# Patient Record
Sex: Female | Born: 1965 | Race: Black or African American | Hispanic: No | Marital: Married | Smoking: Never smoker
Health system: Southern US, Community
[De-identification: ages and names within clinical notes are randomized; demographics above are authoritative.]

## PROBLEM LIST (undated history)

## (undated) DIAGNOSIS — E785 Hyperlipidemia, unspecified: Secondary | ICD-10-CM

## (undated) DIAGNOSIS — I10 Essential (primary) hypertension: Secondary | ICD-10-CM

## (undated) HISTORY — PX: LEG SURGERY: SHX1003

---

## 2014-06-23 DIAGNOSIS — R569 Unspecified convulsions: Secondary | ICD-10-CM

## 2014-06-23 HISTORY — PX: CARDIAC DEFIBRILLATOR PLACEMENT: SHX171

## 2014-06-23 HISTORY — DX: Unspecified convulsions: R56.9

## 2019-12-01 ENCOUNTER — Other Ambulatory Visit: Payer: Self-pay

## 2019-12-01 ENCOUNTER — Emergency Department: Payer: Medicare Other

## 2019-12-01 ENCOUNTER — Emergency Department
Admission: EM | Admit: 2019-12-01 | Discharge: 2019-12-01 | Disposition: A | Discharge status: Home / Self Care | Payer: Medicare Other | Attending: Emergency Medicine | Admitting: Emergency Medicine

## 2019-12-01 DIAGNOSIS — M79605 Pain in left leg: Secondary | ICD-10-CM | POA: Diagnosis not present

## 2019-12-01 DIAGNOSIS — I10 Essential (primary) hypertension: Secondary | ICD-10-CM | POA: Insufficient documentation

## 2019-12-01 DIAGNOSIS — R2243 Localized swelling, mass and lump, lower limb, bilateral: Secondary | ICD-10-CM | POA: Diagnosis not present

## 2019-12-01 DIAGNOSIS — M79604 Pain in right leg: Secondary | ICD-10-CM | POA: Diagnosis present

## 2019-12-01 DIAGNOSIS — Z79899 Other long term (current) drug therapy: Secondary | ICD-10-CM | POA: Diagnosis not present

## 2019-12-01 HISTORY — DX: Essential (primary) hypertension: I10

## 2019-12-01 HISTORY — DX: Hyperlipidemia, unspecified: E78.5

## 2019-12-01 LAB — FIBRIN DERIVATIVES D-DIMER (ARMC ONLY): Fibrin derivatives D-dimer (ARMC): 1297.46 ng/mL (FEU) — ABNORMAL HIGH (ref 0.00–499.00)

## 2019-12-01 LAB — CBC WITH DIFFERENTIAL/PLATELET
Abs Immature Granulocytes: 0 10*3/uL (ref 0.00–0.07)
Basophils Absolute: 0 10*3/uL (ref 0.0–0.1)
Basophils Relative: 1 %
Eosinophils Absolute: 0.2 10*3/uL (ref 0.0–0.5)
Eosinophils Relative: 3 %
HCT: 34.5 % — ABNORMAL LOW (ref 36.0–46.0)
Hemoglobin: 10.6 g/dL — ABNORMAL LOW (ref 12.0–15.0)
Immature Granulocytes: 0 %
Lymphocytes Relative: 32 %
Lymphs Abs: 1.4 10*3/uL (ref 0.7–4.0)
MCH: 28.8 pg (ref 26.0–34.0)
MCHC: 30.7 g/dL (ref 30.0–36.0)
MCV: 93.8 fL (ref 80.0–100.0)
Monocytes Absolute: 0.3 10*3/uL (ref 0.1–1.0)
Monocytes Relative: 8 %
Neutro Abs: 2.5 10*3/uL (ref 1.7–7.7)
Neutrophils Relative %: 56 %
Platelets: 242 10*3/uL (ref 150–400)
RBC: 3.68 MIL/uL — ABNORMAL LOW (ref 3.87–5.11)
RDW: 13.7 % (ref 11.5–15.5)
WBC: 4.4 10*3/uL (ref 4.0–10.5)
nRBC: 0 % (ref 0.0–0.2)

## 2019-12-01 LAB — COMPREHENSIVE METABOLIC PANEL
ALT: 17 U/L (ref 0–44)
AST: 23 U/L (ref 15–41)
Albumin: 4.1 g/dL (ref 3.5–5.0)
Alkaline Phosphatase: 78 U/L (ref 38–126)
Anion gap: 7 (ref 5–15)
BUN: 21 mg/dL — ABNORMAL HIGH (ref 6–20)
CO2: 30 mmol/L (ref 22–32)
Calcium: 9.5 mg/dL (ref 8.9–10.3)
Chloride: 101 mmol/L (ref 98–111)
Creatinine, Ser: 0.95 mg/dL (ref 0.44–1.00)
GFR calc Af Amer: >60 mL/min (ref >60)
GFR calc non Af Amer: >60 mL/min (ref >60)
Glucose, Bld: 111 mg/dL — ABNORMAL HIGH (ref 70–99)
Potassium: 4.3 mmol/L (ref 3.5–5.1)
Sodium: 138 mmol/L (ref 135–145)
Total Bilirubin: 0.6 mg/dL (ref 0.3–1.2)
Total Protein: 7.9 g/dL (ref 6.5–8.1)

## 2019-12-01 LAB — LACTIC ACID, PLASMA
Lactic Acid, Venous: 0.8 mmol/L (ref 0.5–1.9)
Lactic Acid, Venous: 0.7 mmol/L (ref 0.5–1.9)

## 2019-12-01 LAB — PROTIME-INR
INR: 1 (ref 0.8–1.2)
Prothrombin Time: 13.1 seconds (ref 11.4–15.2)

## 2019-12-01 MED ORDER — IOHEXOL 350 MG/ML SOLN
75.0000 mL | Freq: Once | INTRAVENOUS | Status: AC | PRN
  Administered 2019-12-01: 75 mL via INTRAVENOUS
  Filled 2019-12-01: qty 75

## 2019-12-01 MED ORDER — SODIUM CHLORIDE 0.9% FLUSH: 3.0000 mL | Freq: Once | INTRAVENOUS | Status: DC

## 2019-12-01 NOTE — ED Notes (Signed)
See triage note  Presents with swelling to both legs   States she also has had some redness and tenderness   Also has a knot around ankle area  Ambulates well

## 2019-12-01 NOTE — ED Triage Notes (Signed)
PT to ED from home c/o redness, swelling and tenderness to bilateral legs. States right leg has a lump with redness around it. PT denies fevers or SHOB. No known Hx of CHF. Edema is non pitting bilaterally.

## 2019-12-01 NOTE — ED Provider Notes (Signed)
[facility] Emergency Department Provider Note   ____________________________________________   First MD Initiated Contact with Patient 12/01/19 [phone]     (approximate)  I have reviewed the triage vital signs and the nursing notes.   HISTORY  Chief Complaint Leg Pain    HPI Megan David is a 54 y.o. female patient presents with bilateral leg pain and edema.  Patient states he was concerned about a lump redness in the right leg.  Patient denies fever dyspnea or chest pain.  Patient had ultrasound performed of bilateral extremities last month with no acute findings secondary to elevated D-dimer.  Patient is scheduled to see vascular clinic secondary to peripheral edema and varicose veins.  Patient is currently taking Lasix 40 mg for peripheral edema.  Patient rates her pain as a 5/10.  Patient described pain as "achy".  No palliative measure for complaint.         Past Medical History:  Diagnosis Date  . Hyperlipemia   . Hypertension   . Seizure (HCC) 2016    There are no problems to display for this patient.     Prior to Admission medications   Medication Sig Start Date End Date Taking? Authorizing Provider  albuterol (VENTOLIN HFA) 108 (90 Base) MCG/ACT inhaler Inhale into the lungs every 6 (six) hours as needed for wheezing or shortness of breath.   Yes [provider]  atorvastatin (LIPITOR) 40 MG tablet Take 40 mg by mouth daily.   Yes [provider]  carvedilol (COREG) 12.5 MG tablet Take 12.5 mg by mouth 2 (two) times daily with a meal.   Yes [provider]  fluticasone (FLONASE) 50 MCG/ACT nasal spray Place 1 spray into both nostrils daily.   Yes [provider]  furosemide (LASIX) 40 MG tablet Take 40 mg by mouth.   Yes [provider]  lamoTRIgine (LAMICTAL) 150 MG tablet Take 150 mg by mouth daily.   Yes [provider]  losartan (COZAAR) 50 MG tablet Take 50 mg by mouth daily.    Yes [provider]    Allergies Lisinopril  No family history on file.  Social History Social History   Tobacco Use  . Smoking status: Never Smoker  . Smokeless tobacco: Never Used  Substance Use Topics  . Alcohol use: Yes    Comment: occaisonal   . Drug use: Never    Review of Systems Constitutional: No fever/chills.  Eyes: No visual changes. ENT: No sore throat. Cardiovascular: Denies chest pain. Respiratory: Denies shortness of breath. Gastrointestinal: No abdominal pain.  No nausea, no vomiting.  No diarrhea.  No constipation. Genitourinary: Negative for dysuria. Musculoskeletal: Bilateral leg pain.   Skin: Negative for rash.  Bilateral leg edema. Neurological: Negative for headaches, focal weakness or numbness. Endocrine:  Hyperlipidemia and hypertension. Allergic/Immunilogical: Lisinopril. ____________________________________________   PHYSICAL EXAM:  VITAL SIGNS: ED Triage Vitals  Enc Vitals Group     BP 12/01/19 0642 129/69     Pulse Rate 12/01/19 0642 69     Resp 12/01/19 0642 16     Temp 12/01/19 0642 97.9 F (36.6 C)     Temp src --      SpO2 12/01/19 0642 100 %     Weight 12/01/19 0643 (!) 315 lb (142.9 kg)     Height 12/01/19 0643 5\' 3"  (1.6 m)     Head Circumference --      Peak Flow --      Pain Score 12/01/19 0643 5  Pain Loc --      Pain Edu? --      Excl. in [place]? --    Constitutional: Alert and oriented. Well appearing and in no acute distress. Cardiovascular: Normal rate, regular rhythm. Grossly normal heart sounds.  Good peripheral circulation. Respiratory: Normal respiratory effort.  No retractions. Lungs CTAB. Gastrointestinal: Soft and nontender. No distention. No abdominal bruits. No CVA tenderness. Musculoskeletal: Bilateral leg edema. neurologic:  Normal speech and language. No gross focal neurologic deficits are appreciated. No gait instability. Skin:  Skin is warm, dry and intact. No rash noted. Psychiatric: Mood  and affect are normal. Speech and behavior are normal.  ____________________________________________   LABS (all labs ordered are listed, but only abnormal results are displayed)  Labs Reviewed  COMPREHENSIVE METABOLIC PANEL - Abnormal; Notable for the following components:      Result Value   Glucose, Bld 111 (*)    BUN 21 (*)    All other components within normal limits  CBC WITH DIFFERENTIAL/PLATELET - Abnormal; Notable for the following components:   RBC 3.68 (*)    Hemoglobin 10.6 (*)    HCT 34.5 (*)    All other components within normal limits  FIBRIN DERIVATIVES D-DIMER ([facility] ONLY) - Abnormal; Notable for the following components:   Fibrin derivatives D-dimer ([facility]) 1,297.46 (*)    All other components within normal limits  LACTIC ACID, PLASMA  LACTIC ACID, PLASMA  PROTIME-INR  URINALYSIS, COMPLETE (UACMP) WITH MICROSCOPIC   ____________________________________________  EKG   ____________________________________________  RADIOLOGY  ED MD interpretation:    Official radiology report(s): CT ANGIO CHEST PE W OR WO CONTRAST  Result Date: 12/01/2019 CLINICAL DATA:  Left leg DVT. EXAM: CT ANGIOGRAPHY CHEST WITH CONTRAST TECHNIQUE: Multidetector CT imaging of the chest was performed using the standard protocol during bolus administration of intravenous contrast. Multiplanar CT image reconstructions and MIPs were obtained to evaluate the vascular anatomy. CONTRAST:  19mL OMNIPAQUE IOHEXOL 350 MG/ML SOLN COMPARISON:  None. FINDINGS: Cardiovascular: No filling defects in the pulmonary arteries to suggest pulmonary emboli. Cardiomegaly. AICD in place. Aorta normal caliber. No aneurysm or dissection. Mediastinum/Nodes: No mediastinal, hilar, or axillary adenopathy. Trachea and esophagus are unremarkable. Thyroid unremarkable. Lungs/Pleura: No confluent opacities or effusions. Left base atelectasis. Upper Abdomen: Imaging into the upper abdomen shows no acute findings.  Musculoskeletal: Left chest wall pacer battery pack. Chest wall soft tissues unremarkable. No acute bony abnormality. Review of the MIP images confirms the above findings. IMPRESSION: No evidence of pulmonary embolus. Cardiomegaly. Left base atelectasis. No acute cardiopulmonary disease. Electronically Signed   By: Rolm Baptise M.D.   On: 12/01/2019 10:26    ____________________________________________   PROCEDURES  Procedure(s) performed (including Critical Care):  Procedures   ____________________________________________   INITIAL IMPRESSION / ASSESSMENT AND PLAN / ED COURSE  As part of my medical decision making, I reviewed the following data within the [facility]     Patient presents with bilateral lower peripheral edema concerning for DVT.  Patient state i intermitting mild dyspnea but denies chest pain.  Discussed lab results with patient continue to show elevated D-dimer.  Discussed no acute findings on CT angiogram of the chest.  Patient is reassured no acute process at this time will follow-up with schedule vascular clinic.  Continue previous medications.  Return to ED if condition worsens.    Megan David was evaluated in Emergency Department on 12/01/2019 for the symptoms described in the history of present illness. She was evaluated in the  context of the global COVID-19 pandemic, which necessitated consideration that the patient might be at risk for infection with the SARS-CoV-2 virus that causes COVID-19. Institutional protocols and algorithms that pertain to the evaluation of patients at risk for COVID-19 are in a state of rapid change based on information released by regulatory bodies including the CDC and federal and state organizations. These policies and algorithms were followed during the patient's care in the ED.       ____________________________________________   FINAL CLINICAL IMPRESSION(S) / ED DIAGNOSES  Final diagnoses:  Bilateral leg pain      ED Discharge Orders    None       Note:  This document was prepared using Dragon voice recognition software and may include unintentional dictation errors.    Joni Reining, PA-C 12/01/19 1058    Arnaldo Natal, MD 12/01/19 [phone]

## 2019-12-01 NOTE — Discharge Instructions (Addendum)
No acute findings except elevated D-dimer on your labs today.  Your CT angiogram was negative for any acute findings to include DVT.  Follow-up with scheduled vascular clinic for definitive evaluation and treatment.

## 2021-06-25 IMAGING — CT CT ANGIO CHEST
2 of 6 series · 19 of 46 positions shown · IV contrast (APPLIED)
Comparison: None.

CLINICAL DATA: Left leg DVT.

EXAM:
CT ANGIOGRAPHY CHEST WITH CONTRAST
TECHNIQUE: Multidetector CT imaging of the chest was performed using the
standard protocol during bolus administration of intravenous
contrast. Multiplanar CT image reconstructions and MIPs were
obtained to evaluate the vascular anatomy.
CONTRAST:  75mL OMNIPAQUE IOHEXOL 350 MG/ML SOLN

[Series 5: thins · axial · 0.63mm/px · z∈[-655,-430]mm · 16 of 247 slices shown]
[im 11/247  lung]
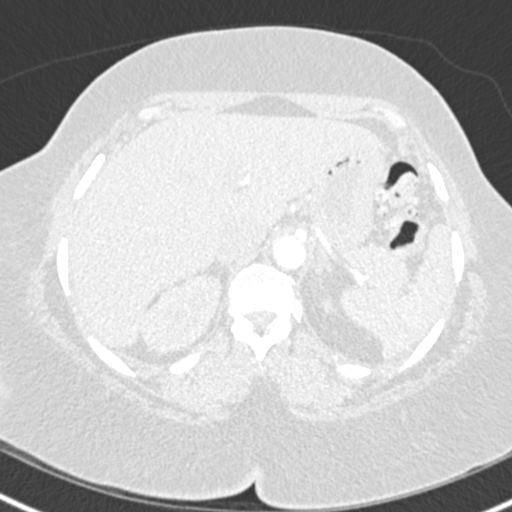
[im 33/247  soft-tissue]
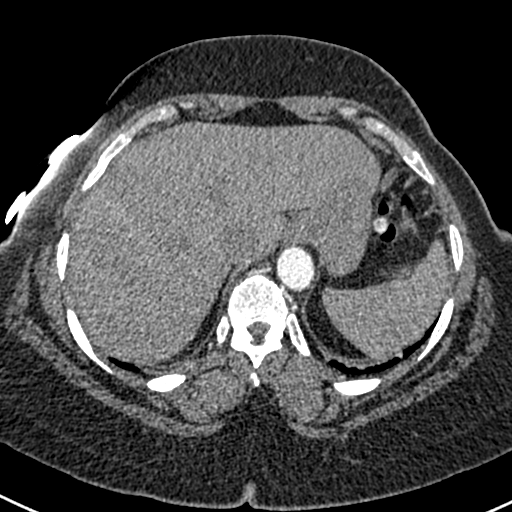
[im 43/247  lung]
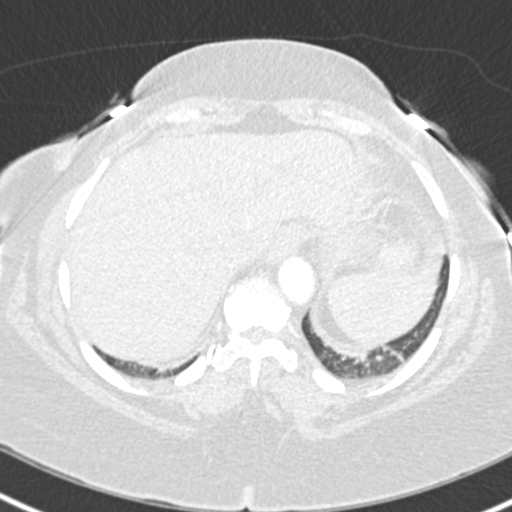
[im 54/247  soft-tissue]
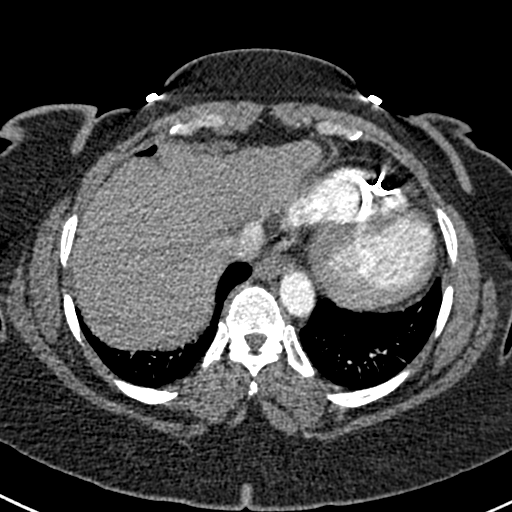
[im 75/247  lung]
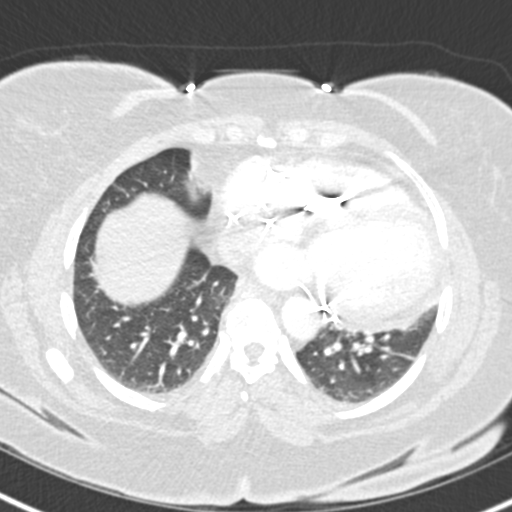
[im 86/247  soft-tissue]
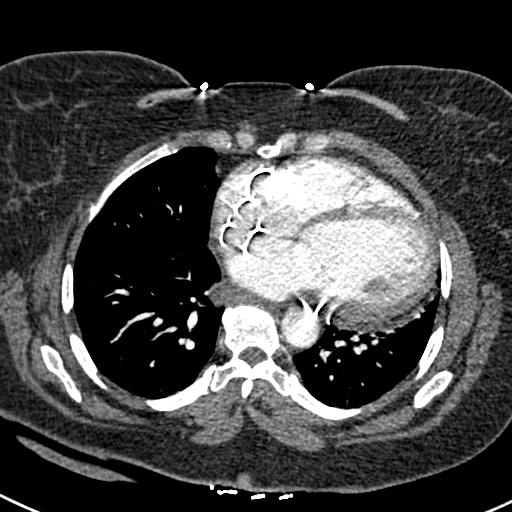
[im 97/247  lung]
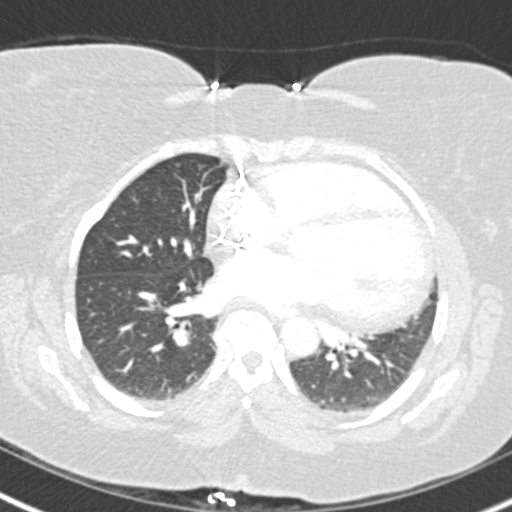
[im 118/247  soft-tissue]
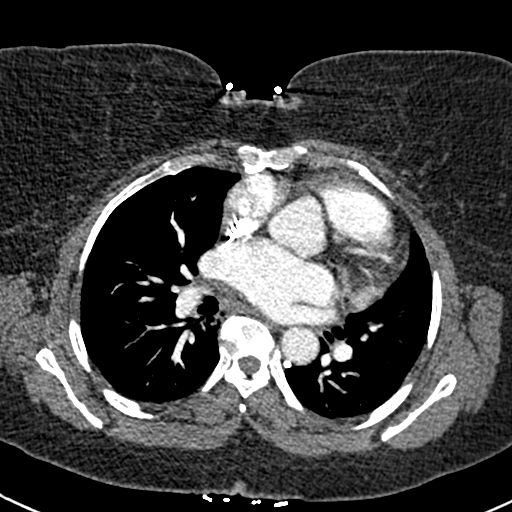
[im 129/247  lung]
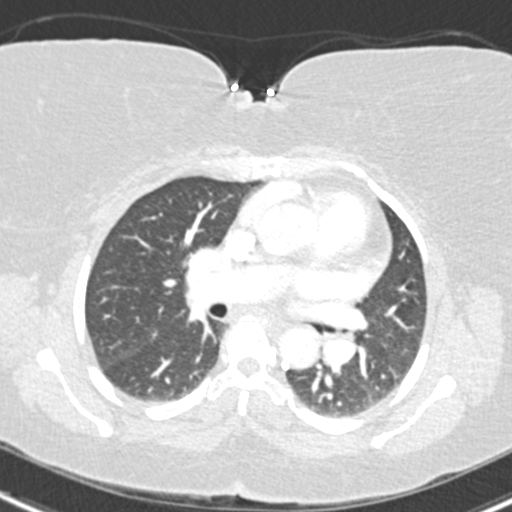
[im 150/247  soft-tissue]
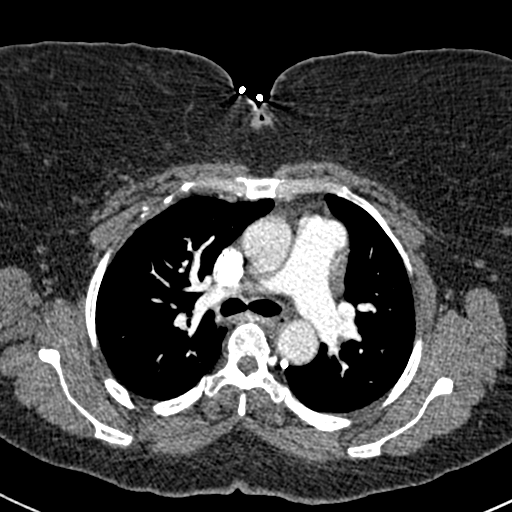
[im 161/247  lung]
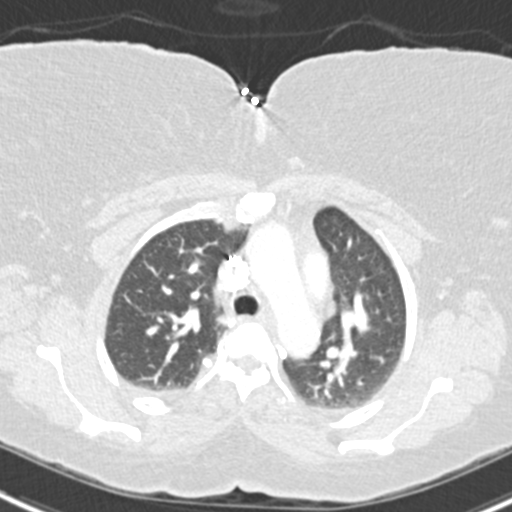
[im 172/247  soft-tissue]
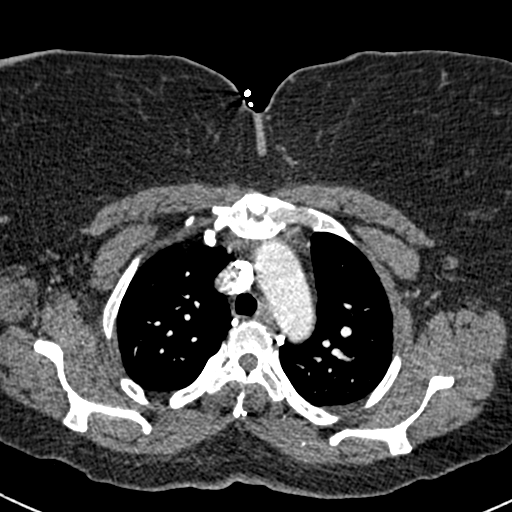
[im 193/247  lung]
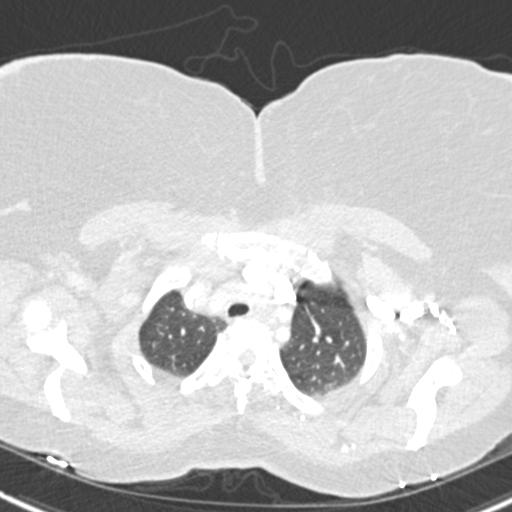
[im 204/247  soft-tissue]
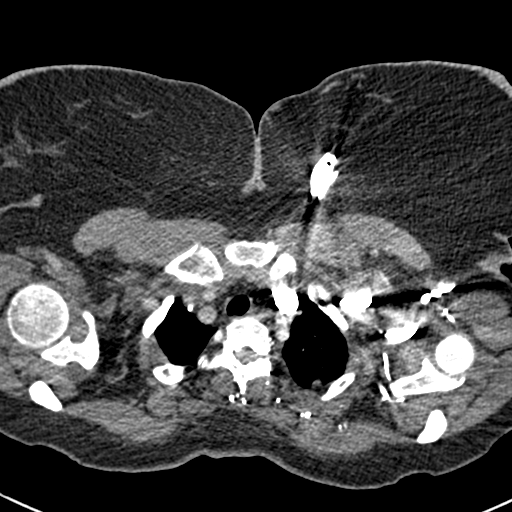
[im 214/247  lung]
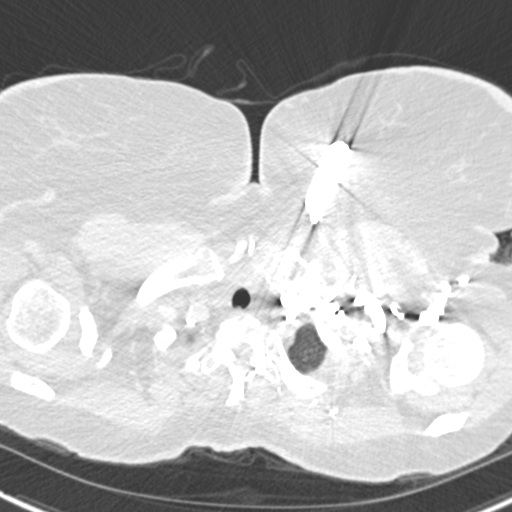
[im 236/247  soft-tissue]
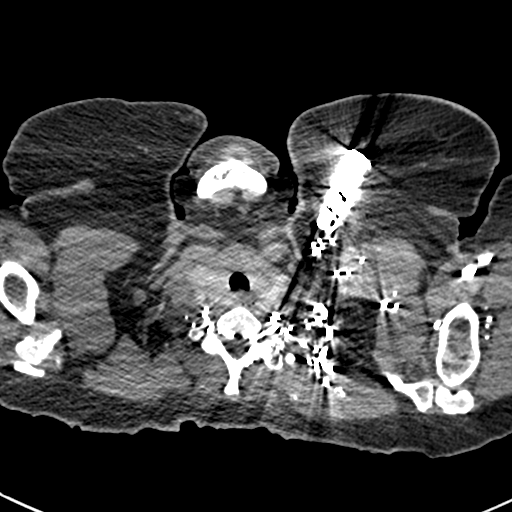

[Series 7: coronal mpr · coronal · 0.51mm/px · 3 of 106 slices shown]
[im 27/106  soft-tissue]
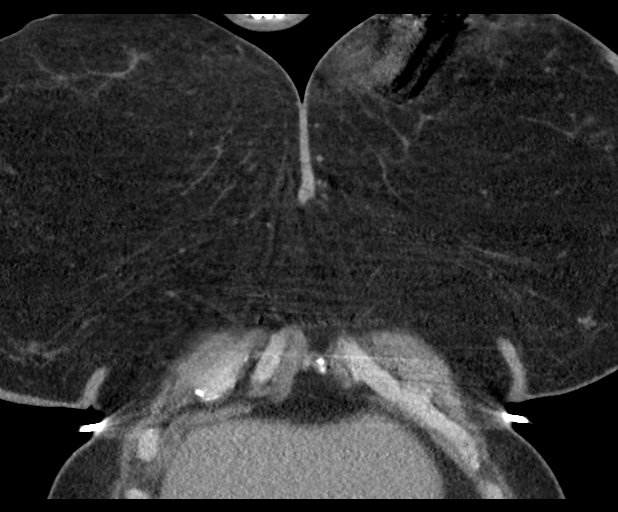
[im 53/106  soft-tissue]
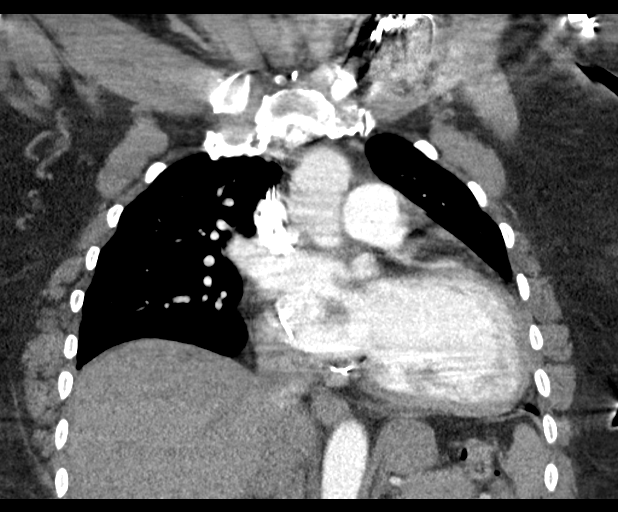
[im 79/106  soft-tissue]
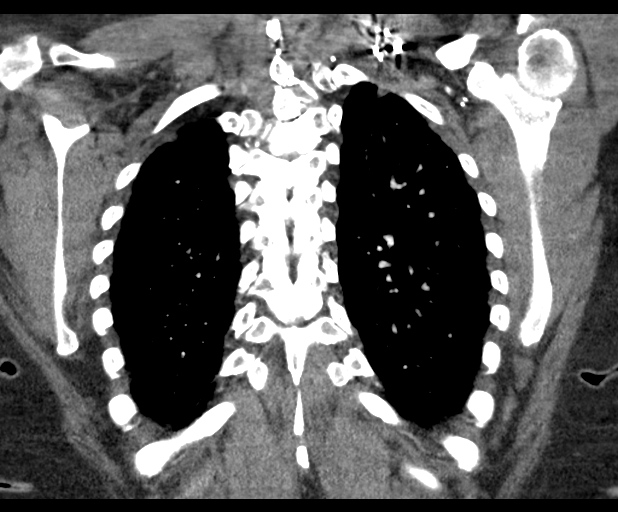

[19 of 46 positions shown; findings below may reference images not displayed]

FINDINGS: Cardiovascular: No filling defects in the pulmonary arteries to
suggest pulmonary emboli. Cardiomegaly. AICD in place. Aorta normal
caliber. No aneurysm or dissection.

Mediastinum/Nodes: No mediastinal, hilar, or axillary adenopathy.
Trachea and esophagus are unremarkable. Thyroid unremarkable.

Lungs/Pleura: No confluent opacities or effusions. Left base
atelectasis.

Upper Abdomen: Imaging into the upper abdomen shows no acute
findings.

Musculoskeletal: Left chest wall pacer battery pack. Chest wall soft
tissues unremarkable. No acute bony abnormality.

Review of the MIP images confirms the above findings.
IMPRESSION: No evidence of pulmonary embolus.

Cardiomegaly.

Left base atelectasis.

No acute cardiopulmonary disease.
